# Patient Record
Sex: Female | Born: 1975 | Race: White | Hispanic: Yes | Marital: Married | Smoking: Never smoker
Health system: Southern US, Community
[De-identification: ages and names within clinical notes are randomized; demographics above are authoritative.]

## PROBLEM LIST (undated history)

## (undated) DIAGNOSIS — Z789 Other specified health status: Secondary | ICD-10-CM

## (undated) HISTORY — PX: NO PAST SURGERIES: SHX2092

---

## 2004-05-12 ENCOUNTER — Ambulatory Visit (HOSPITAL_COMMUNITY): Admission: RE | Admit: 2004-05-12 | Discharge: 2004-05-12 | Payer: Self-pay | Admitting: Obstetrics

## 2004-11-13 ENCOUNTER — Inpatient Hospital Stay (HOSPITAL_COMMUNITY): Admission: AD | Admit: 2004-11-13 | Discharge: 2004-11-15 | Payer: Self-pay | Admitting: Obstetrics

## 2011-12-29 NOTE — L&D Delivery Note (Signed)
Delivery Note At 2:07 PM a viable female was delivered via Vaginal, Spontaneous Delivery (Presentation: Left Occiput Anterior).  APGAR: 9, 9; weight .   Placenta status: , .  Cord: 3 vessels with the following complications: None.  Cord pH: not vdone  Anesthesia: Epidural  Episiotomy: None Lacerations: 1st degree Suture Repair: 2.0 vicryl Est. Blood Loss (mL):   Mom to postpartum.  Baby to nursery-stable.  Michelle Hoover A 10/25/2012, 2:30 PM

## 2012-04-01 LAB — OB RESULTS CONSOLE RPR: RPR: NONREACTIVE

## 2012-04-01 LAB — OB RESULTS CONSOLE HEPATITIS B SURFACE ANTIGEN: Hepatitis B Surface Ag: NEGATIVE

## 2012-04-01 LAB — OB RESULTS CONSOLE GC/CHLAMYDIA
Chlamydia: NEGATIVE
Gonorrhea: NEGATIVE

## 2012-04-01 LAB — OB RESULTS CONSOLE RUBELLA ANTIBODY, IGM: Rubella: IMMUNE

## 2012-04-04 LAB — OB RESULTS CONSOLE HIV ANTIBODY (ROUTINE TESTING): HIV: NONREACTIVE

## 2012-09-15 LAB — OB RESULTS CONSOLE GBS: GBS: POSITIVE

## 2012-10-19 ENCOUNTER — Inpatient Hospital Stay (HOSPITAL_COMMUNITY): Admission: RE | Admit: 2012-10-19 | Payer: Self-pay | Source: Ambulatory Visit | Admitting: Obstetrics

## 2012-10-23 ENCOUNTER — Encounter (HOSPITAL_COMMUNITY): Payer: Self-pay | Admitting: *Deleted

## 2012-10-23 ENCOUNTER — Inpatient Hospital Stay (HOSPITAL_COMMUNITY)
Admission: AD | Admit: 2012-10-23 | Discharge: 2012-10-23 | Disposition: A | Discharge status: Hospice | Payer: Self-pay | Source: Ambulatory Visit | Attending: Obstetrics | Admitting: Obstetrics

## 2012-10-23 DIAGNOSIS — O479 False labor, unspecified: Secondary | ICD-10-CM | POA: Insufficient documentation

## 2012-10-23 HISTORY — DX: Other specified health status: Z78.9

## 2012-10-23 NOTE — MAU Note (Signed)
Pt states she was having contractions 10 min apart this morning but during the day have spaced out-she wants to make sure the baby is OK

## 2012-10-24 ENCOUNTER — Inpatient Hospital Stay (HOSPITAL_COMMUNITY)
Admission: AD | Admit: 2012-10-24 | Discharge: 2012-10-27 | DRG: 775 | Disposition: A | Discharge status: Home / Self Care | Payer: Medicaid Other | Source: Ambulatory Visit | Attending: Obstetrics | Admitting: Obstetrics

## 2012-10-24 ENCOUNTER — Encounter (HOSPITAL_COMMUNITY): Payer: Self-pay

## 2012-10-24 DIAGNOSIS — O09529 Supervision of elderly multigravida, unspecified trimester: Secondary | ICD-10-CM | POA: Diagnosis present

## 2012-10-24 DIAGNOSIS — Z22330 Carrier of Group B streptococcus: Secondary | ICD-10-CM

## 2012-10-24 DIAGNOSIS — O99892 Other specified diseases and conditions complicating childbirth: Secondary | ICD-10-CM | POA: Diagnosis present

## 2012-10-24 LAB — CBC
Hemoglobin: 10.8 g/dL — ABNORMAL LOW (ref 12.0–15.0)
MCHC: 31.3 g/dL (ref 30.0–36.0)
Platelets: 279 10*3/uL (ref 150–400)
RDW: 14.9 % (ref 11.5–15.5)
WBC: 8.9 10*3/uL (ref 4.0–10.5)

## 2012-10-24 MED ORDER — CITRIC ACID-SODIUM CITRATE 334-500 MG/5ML PO SOLN: 30.0000 mL | ORAL | Status: DC | PRN

## 2012-10-24 MED ORDER — OXYCODONE-ACETAMINOPHEN 5-325 MG PO TABS: 1.0000 | ORAL_TABLET | ORAL | Status: DC | PRN

## 2012-10-24 MED ORDER — LACTATED RINGERS IV SOLN
INTRAVENOUS | Status: DC
  Administered 2012-10-24 – 2012-10-25 (×3): via INTRAVENOUS

## 2012-10-24 MED ORDER — LACTATED RINGERS IV SOLN: 500.0000 mL | INTRAVENOUS | Status: DC | PRN

## 2012-10-24 MED ORDER — CLINDAMYCIN PHOSPHATE 900 MG/50ML IV SOLN
900.0000 mg | Freq: Three times a day (TID) | INTRAVENOUS | Status: DC
  Administered 2012-10-24 – 2012-10-25 (×3): 900 mg via INTRAVENOUS
  Filled 2012-10-24 (×5): qty 50

## 2012-10-24 MED ORDER — OXYTOCIN 40 UNITS IN LACTATED RINGERS INFUSION - SIMPLE MED
62.5000 mL/h | INTRAVENOUS | Status: DC
  Filled 2012-10-24: qty 1000

## 2012-10-24 MED ORDER — BUTORPHANOL TARTRATE 1 MG/ML IJ SOLN
1.0000 mg | INTRAMUSCULAR | Status: DC | PRN
  Administered 2012-10-24 – 2012-10-25 (×3): 1 mg via INTRAVENOUS
  Filled 2012-10-24 (×3): qty 1

## 2012-10-24 MED ORDER — OXYTOCIN BOLUS FROM INFUSION
500.0000 mL | INTRAVENOUS | Status: DC
  Filled 2012-10-24 (×87): qty 500

## 2012-10-24 MED ORDER — ONDANSETRON HCL 4 MG/2ML IJ SOLN: 4.0000 mg | Freq: Four times a day (QID) | INTRAMUSCULAR | Status: DC | PRN

## 2012-10-24 MED ORDER — LIDOCAINE HCL (PF) 1 % IJ SOLN
30.0000 mL | INTRAMUSCULAR | Status: DC | PRN
  Filled 2012-10-24: qty 30

## 2012-10-24 MED ORDER — ACETAMINOPHEN 325 MG PO TABS: 650.0000 mg | ORAL_TABLET | ORAL | Status: DC | PRN

## 2012-10-24 MED ORDER — IBUPROFEN 600 MG PO TABS
600.0000 mg | ORAL_TABLET | Freq: Four times a day (QID) | ORAL | Status: DC | PRN
  Filled 2012-10-24 (×5): qty 1

## 2012-10-25 ENCOUNTER — Encounter (HOSPITAL_COMMUNITY): Payer: Self-pay | Admitting: Anesthesiology

## 2012-10-25 ENCOUNTER — Encounter (HOSPITAL_COMMUNITY): Payer: Self-pay | Admitting: *Deleted

## 2012-10-25 ENCOUNTER — Inpatient Hospital Stay (HOSPITAL_COMMUNITY): Payer: Medicaid Other | Admitting: Anesthesiology

## 2012-10-25 LAB — TYPE AND SCREEN

## 2012-10-25 LAB — ABO/RH: ABO/RH(D): O POS

## 2012-10-25 MED ORDER — EPHEDRINE 5 MG/ML INJ: 10.0000 mg | INTRAVENOUS | Status: DC | PRN

## 2012-10-25 MED ORDER — SIMETHICONE 80 MG PO CHEW: 80.0000 mg | CHEWABLE_TABLET | ORAL | Status: DC | PRN

## 2012-10-25 MED ORDER — PHENYLEPHRINE 40 MCG/ML (10ML) SYRINGE FOR IV PUSH (FOR BLOOD PRESSURE SUPPORT)
80.0000 ug | PREFILLED_SYRINGE | INTRAVENOUS | Status: DC | PRN
  Filled 2012-10-25: qty 5

## 2012-10-25 MED ORDER — DIBUCAINE 1 % RE OINT: 1.0000 "application " | TOPICAL_OINTMENT | RECTAL | Status: DC | PRN

## 2012-10-25 MED ORDER — PRENATAL MULTIVITAMIN CH
1.0000 | ORAL_TABLET | Freq: Every day | ORAL | Status: DC
  Administered 2012-10-26 – 2012-10-27 (×2): 1 via ORAL
  Filled 2012-10-25 (×2): qty 1

## 2012-10-25 MED ORDER — LACTATED RINGERS IV SOLN: 500.0000 mL | Freq: Once | INTRAVENOUS | Status: DC

## 2012-10-25 MED ORDER — FENTANYL 2.5 MCG/ML BUPIVACAINE 1/10 % EPIDURAL INFUSION (WH - ANES)
14.0000 mL/h | INTRAMUSCULAR | Status: DC
Start: 2012-10-25 — End: 2012-10-25
  Administered 2012-10-25: 14 mL/h via EPIDURAL
  Filled 2012-10-25: qty 125

## 2012-10-25 MED ORDER — ONDANSETRON HCL 4 MG PO TABS: 4.0000 mg | ORAL_TABLET | ORAL | Status: DC | PRN

## 2012-10-25 MED ORDER — ZOLPIDEM TARTRATE 5 MG PO TABS: 5.0000 mg | ORAL_TABLET | Freq: Every evening | ORAL | Status: DC | PRN

## 2012-10-25 MED ORDER — EPHEDRINE 5 MG/ML INJ
10.0000 mg | INTRAVENOUS | Status: DC | PRN
  Filled 2012-10-25: qty 4

## 2012-10-25 MED ORDER — DIPHENHYDRAMINE HCL 25 MG PO CAPS: 25.0000 mg | ORAL_CAPSULE | Freq: Four times a day (QID) | ORAL | Status: DC | PRN

## 2012-10-25 MED ORDER — PHENYLEPHRINE 40 MCG/ML (10ML) SYRINGE FOR IV PUSH (FOR BLOOD PRESSURE SUPPORT): 80.0000 ug | PREFILLED_SYRINGE | INTRAVENOUS | Status: DC | PRN

## 2012-10-25 MED ORDER — LIDOCAINE HCL (PF) 1 % IJ SOLN
INTRAMUSCULAR | Status: DC | PRN
  Administered 2012-10-25 (×4): 4 mL

## 2012-10-25 MED ORDER — TETANUS-DIPHTH-ACELL PERTUSSIS 5-2.5-18.5 LF-MCG/0.5 IM SUSP
0.5000 mL | Freq: Once | INTRAMUSCULAR | Status: AC
  Administered 2012-10-26: 0.5 mL via INTRAMUSCULAR
  Filled 2012-10-25: qty 0.5

## 2012-10-25 MED ORDER — SENNOSIDES-DOCUSATE SODIUM 8.6-50 MG PO TABS
2.0000 | ORAL_TABLET | Freq: Every day | ORAL | Status: DC
  Administered 2012-10-25 – 2012-10-26 (×2): 2 via ORAL

## 2012-10-25 MED ORDER — BENZOCAINE-MENTHOL 20-0.5 % EX AERO
1.0000 "application " | INHALATION_SPRAY | CUTANEOUS | Status: DC | PRN
  Administered 2012-10-25: 1 via TOPICAL
  Filled 2012-10-25: qty 56

## 2012-10-25 MED ORDER — INFLUENZA VIRUS VACC SPLIT PF IM SUSP
0.5000 mL | INTRAMUSCULAR | Status: AC
  Administered 2012-10-26: 0.5 mL via INTRAMUSCULAR
  Filled 2012-10-25: qty 0.5

## 2012-10-25 MED ORDER — DIPHENHYDRAMINE HCL 50 MG/ML IJ SOLN: 12.5000 mg | INTRAMUSCULAR | Status: DC | PRN

## 2012-10-25 MED ORDER — WITCH HAZEL-GLYCERIN EX PADS: 1.0000 "application " | MEDICATED_PAD | CUTANEOUS | Status: DC | PRN

## 2012-10-25 MED ORDER — OXYCODONE-ACETAMINOPHEN 5-325 MG PO TABS: 1.0000 | ORAL_TABLET | ORAL | Status: DC | PRN

## 2012-10-25 MED ORDER — FERROUS SULFATE 325 (65 FE) MG PO TABS
325.0000 mg | ORAL_TABLET | Freq: Two times a day (BID) | ORAL | Status: DC
  Administered 2012-10-26 – 2012-10-27 (×3): 325 mg via ORAL
  Filled 2012-10-25 (×3): qty 1

## 2012-10-25 MED ORDER — OXYTOCIN 40 UNITS IN LACTATED RINGERS INFUSION - SIMPLE MED
1.0000 m[IU]/min | INTRAVENOUS | Status: DC
  Administered 2012-10-25: 1 m[IU]/min via INTRAVENOUS

## 2012-10-25 MED ORDER — TERBUTALINE SULFATE 1 MG/ML IJ SOLN: 0.2500 mg | Freq: Once | INTRAMUSCULAR | Status: DC | PRN

## 2012-10-25 MED ORDER — IBUPROFEN 600 MG PO TABS
600.0000 mg | ORAL_TABLET | Freq: Four times a day (QID) | ORAL | Status: DC
  Administered 2012-10-25 – 2012-10-27 (×8): 600 mg via ORAL
  Filled 2012-10-25 (×3): qty 1

## 2012-10-25 MED ORDER — ONDANSETRON HCL 4 MG/2ML IJ SOLN: 4.0000 mg | INTRAMUSCULAR | Status: DC | PRN

## 2012-10-25 MED ORDER — LANOLIN HYDROUS EX OINT: TOPICAL_OINTMENT | CUTANEOUS | Status: DC | PRN

## 2012-10-25 NOTE — Progress Notes (Signed)
Debbie the Interpreter at bedside

## 2012-10-25 NOTE — Anesthesia Preprocedure Evaluation (Signed)

## 2012-10-25 NOTE — Anesthesia Procedure Notes (Signed)
Epidural Patient location during procedure: OB Start time: 10/25/2012 7:58 AM  Staffing Performed by: anesthesiologist   Preanesthetic Checklist Completed: patient identified, site marked, surgical consent, pre-op evaluation, timeout performed, IV checked, risks and benefits discussed and monitors and equipment checked  Epidural Patient position: sitting Prep: site prepped and draped and DuraPrep Patient monitoring: continuous pulse ox and blood pressure Approach: midline Injection technique: LOR air  Needle:  Needle type: Tuohy  Needle gauge: 17 G Needle length: 9 cm and 9 Needle insertion depth: 5 cm cm Catheter type: closed end flexible Catheter size: 19 Gauge Catheter at skin depth: 10 cm Test dose: negative  Assessment Events: blood not aspirated, injection not painful, no injection resistance, negative IV test and no paresthesia  Additional Notes Discussed risk of headache, infection, bleeding, nerve injury and failed or incomplete block.  Patient voices understanding and wishes to proceed. Reason for block:procedure for pain

## 2012-10-25 NOTE — H&P (Signed)
This is Dr. Francoise Ceo dictating the history and physical on  Michelle Hoover she's a 36 year old gravida 3 para 2002 at 12 weeks and 3 days [facility] 10/14/2012 she was scheduled for induction last week Wednesday and the patient did not appear at the hospital and were unable to get in touch with her positive GBS and received Cleocin 900 IV every 8 hours admitted in labor cervix 3 cm 90% vertex -2 station amniotomy performed fluid meconium-stained she is contracting on her own every 2-3 minutes Past medical history negative Past surgical history negative Social history noncontributory System review negative Physical exam revealed a well-developed female in labor HEENT negative Lungs clear to P&A Heart regular rhythm no murmurs no gallops Abdomen term Pelvic as described above Extremities negative

## 2012-10-25 NOTE — Progress Notes (Signed)
Patient ID: Michelle Hoover, female   DOB: 1976/06/19, 36 y.o.   MRN: [id number] Patient is 3 cm last night on admission and she's now 7 cm vertex -1-2 station possible OP she is going to get an epidural been IUPC were placed to evaluate her labor

## 2012-10-26 LAB — CBC
HCT: 28.5 % — ABNORMAL LOW (ref 36.0–46.0)
MCH: 24.4 pg — ABNORMAL LOW (ref 26.0–34.0)
MCHC: 30.9 g/dL (ref 30.0–36.0)
RDW: 15.4 % (ref 11.5–15.5)

## 2012-10-26 NOTE — Progress Notes (Signed)
UR chart review completed.  

## 2012-10-26 NOTE — Progress Notes (Signed)
Patient ID: Michelle Hoover, female   DOB: 1976/08/23, 36 y.o.   MRN: [id number] Postpartum day one Vital signs normal Fundus firm Lochia moderate Legs negative doing well and and

## 2012-10-26 NOTE — Anesthesia Postprocedure Evaluation (Signed)
  Anesthesia Post-op Note  Patient: Michelle Hoover  Procedure(s) Performed: * No procedures listed *  Patient Location: Mother/Baby  Anesthesia Type:Epidural  Level of Consciousness: awake, alert  and oriented  Airway and Oxygen Therapy: Patient Spontanous Breathing  Post-op Pain: none  Post-op Assessment: Patient's Cardiovascular Status Stable, Respiratory Function Stable, Patent Airway, No signs of Nausea or vomiting, Adequate PO intake, Pain level controlled, No headache, No backache, No residual numbness and No residual motor weakness  Post-op Vital Signs: Reviewed and stable  Complications: No apparent anesthesia complications

## 2012-10-27 NOTE — Discharge Summary (Signed)
  Discharge instructions   You can wash your hair  Shower  Eat what you want  Drink what you want  See me in 6 weeks  Your ankles are going to swell more in the next 2 weeks than when pregnant  No sex for 6 weeks   MARSHALL,BERNARD A, MD 10/27/2012

## 2012-10-27 NOTE — Discharge Summary (Signed)
Obstetric Discharge Summary Reason for Admission: onset of labor Prenatal Procedures: none Intrapartum Procedures: spontaneous vaginal delivery Postpartum Procedures: none Complications-Operative and Postpartum: none Hemoglobin  Date Value Range Status  10/26/2012 8.8* 12.0 - 15.0 g/dL Final     DELTA CHECK NOTED     REPEATED TO VERIFY     HCT  Date Value Range Status  10/26/2012 28.5* 36.0 - 46.0 % Final    Physical Exam:  General: alert Lochia: appropriate Uterine Fundus: firm Incision: healing well DVT Evaluation: No evidence of DVT seen on physical exam.  Discharge Diagnoses: Term Pregnancy-delivered  Discharge Information: Date: 10/27/2012 Activity: pelvic rest Diet: routine Medications: Percocet Condition: stable Instructions: refer to practice specific booklet Discharge to: home Follow-up Information    Call in 6 weeks to follow up.   Contact information:   b marshall         Newborn Data: Live born female  Birth Weight: 8 lb 3 oz (3715 g) APGAR: 9, 9  Home with mother.  MARSHALL,BERNARD A 10/27/2012, 6:55 AM

## 2014-10-29 ENCOUNTER — Encounter (HOSPITAL_COMMUNITY): Payer: Self-pay | Admitting: *Deleted

## 2017-02-08 ENCOUNTER — Ambulatory Visit: Payer: Self-pay | Attending: Family Medicine | Admitting: Family Medicine

## 2017-02-08 VITALS — BP 105/68 | HR 55 | Temp 98.2°F | Resp 18 | Ht 63.0 in | Wt 150.4 lb

## 2017-02-08 DIAGNOSIS — W009XXA Unspecified fall due to ice and snow, initial encounter: Secondary | ICD-10-CM

## 2017-02-08 DIAGNOSIS — S0990XA Unspecified injury of head, initial encounter: Secondary | ICD-10-CM | POA: Insufficient documentation

## 2017-02-08 DIAGNOSIS — R42 Dizziness and giddiness: Secondary | ICD-10-CM | POA: Insufficient documentation

## 2017-02-08 DIAGNOSIS — M542 Cervicalgia: Secondary | ICD-10-CM | POA: Insufficient documentation

## 2017-02-08 DIAGNOSIS — W000XXA Fall on same level due to ice and snow, initial encounter: Secondary | ICD-10-CM | POA: Insufficient documentation

## 2017-02-08 MED ORDER — IBUPROFEN 800 MG PO TABS: 800.0000 mg | ORAL_TABLET | Freq: Three times a day (TID) | ORAL | 0 refills | Status: DC | PRN

## 2017-02-08 NOTE — Progress Notes (Signed)
Patient is here because she complains head & neck pain due to slip on ice  Patient has eaten today  Patient has not taking any meds today

## 2017-02-08 NOTE — Progress Notes (Signed)
Subjective:  Patient ID: Michelle Hoover, female    DOB: 11/14/1976  Age: 41 y.o. MRN: [id number]  CC: Establish Care   HPI Michelle Hoover presents for complaints of headache pain, neck pain, and dizziness. Reports 4 weeks ago slipping on a patch of ice falling backwards and hitting her head on the ground. She reports intermittent headaches. She reports pain 6 out of 10. She denies any blurred vision, nausea, or vomiting, tinnitus, weakness of the extremities, or paresthesias. She denies any tenderness or lump. She reports dizziness for a week after her fall. She reports dizziness is exacerbated with bending forward. Reports last episode of dizziness Monday of last week. She reports taking over-the-counter ibuprofen with minimal relief of symptoms.   No outpatient prescriptions prior to visit.   No facility-administered medications prior to visit.     ROS Review of Systems  Eyes: Negative.   Respiratory: Negative.   Cardiovascular: Negative.   Musculoskeletal: Positive for myalgias.  Neurological: Positive for dizziness and headaches.     Objective:  BP 105/68 (BP Location: Left Arm, Patient Position: Sitting, Cuff Size: Normal)   Pulse (!) 55   Temp 98.2 F (36.8 C) (Oral)   Resp 18   Ht 5\' 3"  (1.6 m)   Wt 150 lb 6.4 oz (68.2 kg)   LMP 02/07/2017   SpO2 97%   BMI 26.64 kg/m   BP/Weight 02/08/2017 10/27/2012 10/24/2012  Systolic BP 105 116 -  Diastolic BP 68 68 -  Wt. (Lbs) 150.4 - 164  BMI 26.64 - 32.03     Physical Exam  Constitutional: She is oriented to person, place, and time.  HENT:  Head: Normocephalic.    Right Ear: External ear normal.  Left Ear: External ear normal.  Nose: Nose normal.  Mouth/Throat: Oropharynx is clear and moist.  Eyes: Conjunctivae and EOM are normal. Pupils are equal, round, and reactive to light.  Neck: Normal range of motion.  Cardiovascular: Normal rate, regular rhythm, normal heart sounds and intact distal pulses.     Pulmonary/Chest: Effort normal and breath sounds normal.  Abdominal: Soft. Bowel sounds are normal.  Musculoskeletal: She exhibits tenderness.       Cervical back: She exhibits pain.  Neurological: She is alert and oriented to person, place, and time. She has normal reflexes. No cranial nerve deficit. Coordination and gait normal.  Skin: Skin is warm and dry.  Psychiatric: She has a normal mood and affect. Her behavior is normal. Thought content normal.  Nursing note and vitals reviewed.    Assessment & Plan:   Problem List Items Addressed This Visit    None    Visit Diagnoses    Fall due to ice or snow, initial encounter    -  Primary   Relevant Orders   CT HEAD WO CONTRAST (Completed)   Recent head trauma, initial encounter       Relevant Orders   CT HEAD WO CONTRAST (Completed)   Musculoskeletal neck pain       Relevant Medications   ibuprofen (ADVIL,MOTRIN) 800 MG tablet      Meds ordered this encounter  Medications  . ibuprofen (ADVIL,MOTRIN) 800 MG tablet    Sig: Take 1 tablet (800 mg total) by mouth every 8 (eight) hours as needed for moderate pain.    Dispense:  40 tablet    Refill:  0    Order Specific Question:   Supervising Provider    Answer:   Quentin AngstJEGEDE, OLUGBEMIGA E [id number]  Alfonse Spruce FNP

## 2017-02-09 ENCOUNTER — Encounter (HOSPITAL_COMMUNITY): Payer: Self-pay

## 2017-02-09 ENCOUNTER — Ambulatory Visit (HOSPITAL_COMMUNITY)
Admission: RE | Admit: 2017-02-09 | Discharge: 2017-02-09 | Disposition: A | Discharge status: Home / Self Care | Payer: Self-pay | Source: Ambulatory Visit | Attending: Family Medicine | Admitting: Family Medicine

## 2017-02-09 DIAGNOSIS — W009XXA Unspecified fall due to ice and snow, initial encounter: Secondary | ICD-10-CM | POA: Insufficient documentation

## 2017-02-09 DIAGNOSIS — S0990XA Unspecified injury of head, initial encounter: Secondary | ICD-10-CM | POA: Insufficient documentation

## 2017-02-15 ENCOUNTER — Telehealth: Payer: Self-pay

## 2017-02-15 NOTE — Telephone Encounter (Signed)
-----   Message from Lizbeth BarkMandesia R Hairston, FNP sent at 02/15/2017  4:41 PM EST ----- -Imaging study of the head was normal. No fractures, bleeding, swelling, or mass of the brain or skull present.  -Follow up if symptoms do not improve. -If you start to develop any confusion, projectile vomiting, vision disturbance, difficulty keeping your balance or decreased level of consciousness got to the emergency department.

## 2017-02-15 NOTE — Telephone Encounter (Signed)
CMA call to go over ct scan of head  Patient did not answer VM was full to leave a message

## 2017-02-16 NOTE — Telephone Encounter (Signed)
CMA call to go over results of CT scan of head  Patient verify DOB  Patient was aware and understood the results being normal

## 2017-02-16 NOTE — Telephone Encounter (Signed)
-----   Message from Mandesia R Hairston, FNP sent at 02/15/2017  4:41 PM EST ----- -Imaging study of the head was normal. No fractures, bleeding, swelling, or mass of the brain or skull present.  -Follow up if symptoms do not improve. -If you start to develop any confusion, projectile vomiting, vision disturbance, difficulty keeping your balance or decreased level of consciousness got to the emergency department.   

## 2017-02-17 ENCOUNTER — Ambulatory Visit (INDEPENDENT_AMBULATORY_CARE_PROVIDER_SITE_OTHER): Payer: Self-pay | Admitting: Internal Medicine

## 2017-02-17 ENCOUNTER — Encounter: Payer: Self-pay | Admitting: Internal Medicine

## 2017-02-17 ENCOUNTER — Ambulatory Visit: Payer: Self-pay

## 2017-02-17 VITALS — BP 100/64 | HR 78 | Resp 16 | Ht 62.0 in | Wt 150.5 lb

## 2017-02-17 DIAGNOSIS — K029 Dental caries, unspecified: Secondary | ICD-10-CM

## 2017-02-17 NOTE — Progress Notes (Signed)
   Subjective:    Patient ID: Michelle Hoover, female    DOB: 06/28/1976, 41 y.o.   MRN: [id number]  HPI Here to establish  1.  Slipped on ice about 1 month ago, striking her occipital head on cement. [facility].  Wants to know if CT scan of head was okay and it was.  Discussed Discussed also she needs to pick one place to be her primary care and not go back and forth between clinics.   She has an orange card with [employer].  Discussed if she chooses to continue here, she needs to renew with [employer] on card.  2.  Has cavity in upper right molar.  Would like to get it looked at and treated by Dental Clinic.  Current Meds  Medication Sig  . ibuprofen (ADVIL,MOTRIN) 800 MG tablet Take 1 tablet (800 mg total) by mouth every 8 (eight) hours as needed for moderate pain.    Allergies  Allergen Reactions  . Penicillins Other (See Comments)    Rxn from childhood; lost all feeling in legs immediately after IM injection in hip.    No past medical history on file.   Past Surgical History:  Procedure Laterality Date  . NO PAST SURGERIES      Family History  Problem Relation Age of Onset  . Cancer Paternal Uncle   . Diabetes Paternal Grandmother   . Hypertension Mother   . Stroke Mother   . Diabetes Father   . Peripheral vascular disease Father     amputations of legs  . Epilepsy Sister     Social History   Social History  . Marital status: Married    Spouse name: Michelle Hoover  . Number of children: 3  . Years of education: 9   Occupational History  . Housewife    Social History Main Topics  . Smoking status: Never Smoker  . Smokeless tobacco: Never Used  . Alcohol use No  . Drug use: No  . Sexual activity: Not Currently   Other Topics Concern  . Not on file   Social History Narrative   Originally [place]   [place]   Lives at home with husband and 3 childrens    Review of Systems     Objective:   Physical Exam NAD HEENT:  PERRL, EOMI, TMs pearly gray, throat without injection.  Many fillings. Darkening in right premolar she points to. Neck:  Supple, No adenopathy Chest:  CTA CV:  RRR with normal S1 and S2, No S3, S4 or murmur, radial pulses normal and equal       Assessment & Plan:  1.  Dental decay:  Dental referral.  Discussed will be a delay as not opening up to new patients until mid March  Return for fasting labs and CPE with pap in 3 months

## 2017-05-27 ENCOUNTER — Encounter: Payer: Self-pay | Admitting: Internal Medicine

## 2017-05-27 ENCOUNTER — Ambulatory Visit (INDEPENDENT_AMBULATORY_CARE_PROVIDER_SITE_OTHER): Payer: Self-pay | Admitting: Internal Medicine

## 2017-05-27 VITALS — BP 112/82 | HR 74 | Resp 12 | Ht 62.0 in | Wt 144.0 lb

## 2017-05-27 DIAGNOSIS — Z124 Encounter for screening for malignant neoplasm of cervix: Secondary | ICD-10-CM

## 2017-05-27 DIAGNOSIS — Z131 Encounter for screening for diabetes mellitus: Secondary | ICD-10-CM

## 2017-05-27 DIAGNOSIS — Z13220 Encounter for screening for lipoid disorders: Secondary | ICD-10-CM

## 2017-05-27 DIAGNOSIS — Z1231 Encounter for screening mammogram for malignant neoplasm of breast: Secondary | ICD-10-CM

## 2017-05-27 DIAGNOSIS — Z0000 Encounter for general adult medical examination without abnormal findings: Secondary | ICD-10-CM

## 2017-05-27 DIAGNOSIS — Z1239 Encounter for other screening for malignant neoplasm of breast: Secondary | ICD-10-CM

## 2017-05-27 LAB — POCT WET PREP WITH KOH
Clue Cells Wet Prep HPF POC: NEGATIVE
KOH PREP POC: NEGATIVE
RBC Wet Prep HPF POC: NEGATIVE
Trichomonas, UA: NEGATIVE
YEAST WET PREP PER HPF POC: NEGATIVE

## 2017-05-27 NOTE — Progress Notes (Signed)
Subjective:    Patient ID: Michelle Hoover, female    DOB: 07-21-1976, 41 y.o.   MRN: [id number]  HPI   CPE with pap  1.  Pap: Never.  No family history  2.  Mammogram:  Never.  Distant cousin of her mother had breast cancer.  3.  Osteoprevention:  One serving of dairy daily. Runs and walks 1 hour daily.  No family history of osteoporosis.  4.  Guaiac Cards:  Never.  No family history  5.  Colonoscopy:  Never.    6.  Immunizations:   Immunization History  Administered Date(s) Administered  . Influenza Split 10/26/2012  . Tdap 10/26/2012    7.  Glucose/Cholesterol:  No history of elevated glucose or cholesterol.  No outpatient prescriptions have been marked as taking for the 05/27/17 encounter (Office Visit) with Julieanne Manson, MD.   Allergies  Allergen Reactions  . Penicillins Other (See Comments)    Rxn from childhood; lost all feeling in legs immediately after IM injection in hip.   No past medical history on file.   No past surgical history on file.   Family History  Problem Relation Age of Onset  . Cancer Paternal Uncle   . Diabetes Paternal Grandmother   . Hypertension Mother   . Stroke Mother   . Diabetes Father   . Peripheral vascular disease Father        amputations of legs  . Epilepsy Sister     Social History   Social History  . Marital status: Married    Spouse name: Madelin Headings  . Number of children: 3  . Years of education: 9   Occupational History  . Housewife    Social History Main Topics  . Smoking status: Never Smoker  . Smokeless tobacco: Never Used  . Alcohol use No  . Drug use: No  . Sexual activity: Yes    Birth control/ protection: None   Other Topics Concern  . Not on file   Social History Narrative   Originally from [place]   C[place] to [place] In [place]   Lives at home with husband and 3 children      Review of Systems  Constitutional: Negative for appetite change, fatigue, fever and unexpected weight change.    HENT: Negative for dental problem, ear pain, hearing loss, postnasal drip, rhinorrhea, sneezing and sore throat.   Eyes: Negative for pain, redness, itching and visual disturbance.  Respiratory: Negative for cough and shortness of breath.   Cardiovascular: Negative for chest pain, palpitations and leg swelling.  Gastrointestinal: Negative for abdominal distention, blood in stool (No melena), constipation, diarrhea and nausea.  Genitourinary: Negative for dysuria, frequency, pelvic pain and vaginal discharge.  Musculoskeletal: Negative for arthralgias and joint swelling.  Skin: Negative for rash.  Neurological: Negative for dizziness, weakness, numbness and headaches.  Hematological: Negative for adenopathy. Does not bruise/bleed easily.  Psychiatric/Behavioral: Negative for dysphoric mood. The patient is not nervous/anxious.        Objective:   Physical Exam  Constitutional: She is oriented to person, place, and time. She appears well-developed and well-nourished.  HENT:  Head: Normocephalic and atraumatic.  Right Ear: Hearing, tympanic membrane, external ear and ear canal normal.  Left Ear: Hearing, tympanic membrane, external ear and ear canal normal.  Nose: Nose normal.  Mouth/Throat: Oropharynx is clear and moist and mucous membranes are normal.  Eyes: Conjunctivae and EOM are normal. Pupils are equal, round, and reactive to light.  Discs sharp bilaterally  Neck:  Normal range of motion and full passive range of motion without pain. Neck supple. No thyromegaly present.  Cardiovascular: Normal rate, regular rhythm, S1 normal, S2 normal and intact distal pulses.  Exam reveals no S3, no S4 and no friction rub.   No murmur heard. No carotid bruits.  Carotid, radial, femoral, DP and PT pulses normal and equal.   Pulmonary/Chest: Effort normal and breath sounds normal. Right breast exhibits no inverted nipple, no mass, no nipple discharge, no skin change and no tenderness. Left breast  exhibits no inverted nipple, no mass, no nipple discharge, no skin change and no tenderness.  Abdominal: Soft. Bowel sounds are normal. She exhibits no mass. There is no hepatosplenomegaly. There is no tenderness. No hernia.  Genitourinary:  Genitourinary Comments: Normal external genitalia.  No vaginal discharge,  No uterine or adnexal mass or tenderness. Rectal:  No mass, good tone.  Heme negative light brown stool.     Musculoskeletal: Normal range of motion.  Prominent right tibial tuberosity, nontender.  Lymphadenopathy:       Head (right side): No submental and no submandibular adenopathy present.       Head (left side): No submental and no submandibular adenopathy present.    She has no cervical adenopathy.    She has no axillary adenopathy.       Right: No inguinal and no supraclavicular adenopathy present.       Left: No inguinal and no supraclavicular adenopathy present.  Neurological: She is alert and oriented to person, place, and time. She has normal strength and normal reflexes. No cranial nerve deficit or sensory deficit. Coordination and gait normal.  Skin: Skin is warm and dry. No lesion and no rash noted.  Psychiatric: She has a normal mood and affect. Her speech is normal and behavior is normal. Judgment and thought content normal. Cognition and memory are normal.       Assessment & Plan:  1.  CPE with pap Scholarship for screening Mammogram Guaiac Cards x 3, to return in 2 weeks. CBC, CMP, Lipid panel Influenza vaccine recommended each fall Encourage 3 servings of dairy and deep green leafy vegetables daily

## 2017-05-28 LAB — COMPREHENSIVE METABOLIC PANEL
ALBUMIN: 4.5 g/dL (ref 3.5–5.5)
ALT: 16 IU/L (ref 0–32)
AST: 20 IU/L (ref 0–40)
Albumin/Globulin Ratio: 1.7 (ref 1.2–2.2)
Alkaline Phosphatase: 62 IU/L (ref 39–117)
BILIRUBIN TOTAL: 0.7 mg/dL (ref 0.0–1.2)
BUN / CREAT RATIO: 11 (ref 9–23)
BUN: 9 mg/dL (ref 6–24)
CO2: 25 mmol/L (ref 18–29)
CREATININE: 0.81 mg/dL (ref 0.57–1.00)
Calcium: 9.2 mg/dL (ref 8.7–10.2)
Chloride: 105 mmol/L (ref 96–106)
GFR calc non Af Amer: 90 mL/min/{1.73_m2} (ref >59)
GFR, EST AFRICAN AMERICAN: 104 mL/min/{1.73_m2} (ref >59)
GLOBULIN, TOTAL: 2.6 g/dL (ref 1.5–4.5)
Glucose: 81 mg/dL (ref 65–99)
Potassium: 4.5 mmol/L (ref 3.5–5.2)
SODIUM: 142 mmol/L (ref 134–144)
TOTAL PROTEIN: 7.1 g/dL (ref 6.0–8.5)

## 2017-05-28 LAB — CBC WITH DIFFERENTIAL/PLATELET
Basophils Absolute: 0 10*3/uL (ref 0.0–0.2)
Basos: 0 %
EOS (ABSOLUTE): 0.1 10*3/uL (ref 0.0–0.4)
EOS: 1 %
HEMATOCRIT: 38.8 % (ref 34.0–46.6)
HEMOGLOBIN: 12.7 g/dL (ref 11.1–15.9)
IMMATURE GRANS (ABS): 0 10*3/uL (ref 0.0–0.1)
Immature Granulocytes: 0 %
LYMPHS ABS: 1.7 10*3/uL (ref 0.7–3.1)
Lymphs: 31 %
MCH: 28.2 pg (ref 26.6–33.0)
MCHC: 32.7 g/dL (ref 31.5–35.7)
MCV: 86 fL (ref 79–97)
MONOCYTES: 6 %
Monocytes Absolute: 0.3 10*3/uL (ref 0.1–0.9)
NEUTROS ABS: 3.4 10*3/uL (ref 1.4–7.0)
Neutrophils: 62 %
Platelets: 284 10*3/uL (ref 150–379)
RBC: 4.5 x10E6/uL (ref 3.77–5.28)
RDW: 13.4 % (ref 12.3–15.4)
WBC: 5.4 10*3/uL (ref 3.4–10.8)

## 2017-05-28 LAB — LIPID PANEL W/O CHOL/HDL RATIO
Cholesterol, Total: 156 mg/dL (ref 100–199)
HDL: 42 mg/dL (ref >39)
LDL CALC: 87 mg/dL (ref 0–99)
Triglycerides: 133 mg/dL (ref 0–149)
VLDL CHOLESTEROL CAL: 27 mg/dL (ref 5–40)

## 2017-05-31 LAB — CYTOLOGY - PAP

## 2017-06-08 ENCOUNTER — Other Ambulatory Visit (INDEPENDENT_AMBULATORY_CARE_PROVIDER_SITE_OTHER): Payer: Self-pay

## 2017-06-08 DIAGNOSIS — Z1211 Encounter for screening for malignant neoplasm of colon: Secondary | ICD-10-CM

## 2017-06-08 LAB — POC HEMOCCULT BLD/STL (HOME/3-CARD/SCREEN)
Card #2 Fecal Occult Blod, POC: NEGATIVE
Card #3 Fecal Occult Blood, POC: NEGATIVE
Fecal Occult Blood, POC: NEGATIVE

## 2017-10-28 ENCOUNTER — Telehealth: Payer: Self-pay | Admitting: Internal Medicine

## 2017-10-28 NOTE — Telephone Encounter (Signed)
Please schedule for acute appointment for tomorrow

## 2017-10-28 NOTE — Telephone Encounter (Signed)
Patient called stating felt down a week ago when was walking out with her dog, and is feeling a little bit of pain in her hand when needs to hold or take anything with it. Patient stated hand is a little bit swollen and is taking ibuprofen every 8 hours for the pain.  Please adavise.

## 2017-10-29 ENCOUNTER — Encounter: Payer: Self-pay | Admitting: Internal Medicine

## 2017-10-29 ENCOUNTER — Ambulatory Visit (INDEPENDENT_AMBULATORY_CARE_PROVIDER_SITE_OTHER): Payer: Self-pay | Admitting: Internal Medicine

## 2017-10-29 VITALS — BP 118/78 | HR 62 | Resp 12 | Ht 62.0 in | Wt 143.0 lb

## 2017-10-29 DIAGNOSIS — Z23 Encounter for immunization: Secondary | ICD-10-CM

## 2017-10-29 DIAGNOSIS — B079 Viral wart, unspecified: Secondary | ICD-10-CM

## 2017-10-29 DIAGNOSIS — M25531 Pain in right wrist: Secondary | ICD-10-CM

## 2017-10-29 NOTE — Telephone Encounter (Signed)
Patient contacted to come to the clinic for an acute visit.

## 2017-10-29 NOTE — Progress Notes (Signed)
   Subjective:    Patient ID: Michelle Hoover, female    DOB: 04/15/1976, 41 y.o.   MRN: [id number]  HPI   Larey SeatFell forward 11 days ago on outstretched hands, more weight on right hand on cement.   She was standing after picking up her smallish dog to protect him from a larger dog.  Her dog jumped out of her arms and she fell forward trying to hold onto him. She has continue to have right wrist pain. Points to her distal ulna as source of pain.  Occurs when she is trying to lift something heavy but does not bother her with picking up light objects. Has taken Ibuprofen does not really help if picking up something heavy.    Current Meds  Medication Sig  . acetaminophen (TYLENOL) 500 MG tablet Take 500 mg by mouth every 6 (six) hours as needed.    Allergies  Allergen Reactions  . Penicillins Other (See Comments)    Rxn from childhood; lost all feeling in legs immediately after IM injection in hip.    Review of Systems     Objective:   Physical Exam  NAD Right wrist without swelling, erythema or disfigurement.  Full ROM, though with mild discomfort with ulnar deviation of wrist.  Mild tenderness over ulna styloid.  No crepitation or palpable effusion at ulnar wrist.  NT over carpals.   Radial pulse normal.  Good cap refill.  Normal sensory to light touch of each digit. Wart at PIP of right ring finger.      Assessment & Plan:  1.  Fall with continued mild pain at right ulna styloid area.  [facility].  Discussed subsequently applying for financial assistance [facility]. Will be unable to get a film through orange card to assess current situation anytime soon.  2.  Wart, right ring finger:  40% salicylic acid plasters for 6 weeks.  Discussed at length how to utilize.  3.  HM:  Influenza vaccine.

## 2017-10-29 NOTE — Patient Instructions (Signed)
Mediplast 40% salacylic acid --apply to wart and change daily for up to 6 weeks.

## 2018-07-24 IMAGING — CT CT HEAD W/O CM
3 series · 15 of 47 positions shown, 18 images · non-contrast
Comparison: None available

CLINICAL DATA: Fall, head injury, posterior headaches

EXAM:
CT HEAD WITHOUT CONTRAST
TECHNIQUE: Contiguous axial images were obtained from the base of the skull
through the vertex without intravenous contrast.

[Series 2: head wo · axial · 0.47mm/px · z∈[-124,+1]mm · 9 of 30 slices shown, 12 images]
[im 3/30  brain]
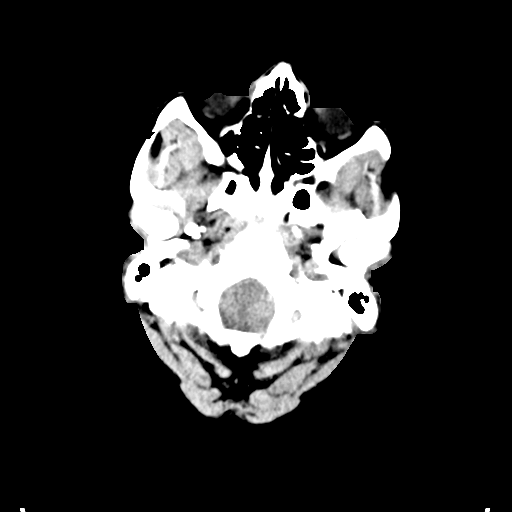
[im 3/30  bone]
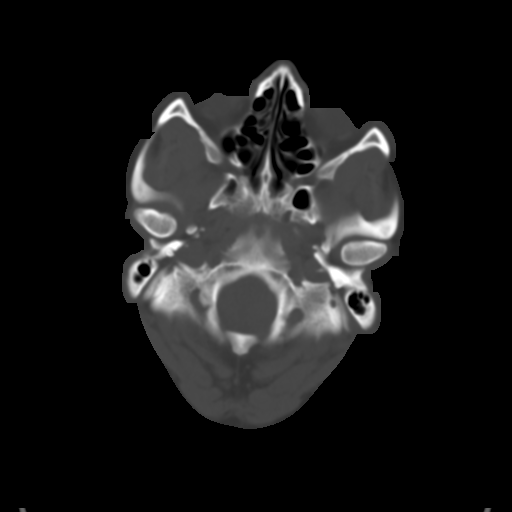
[im 6/30  brain]
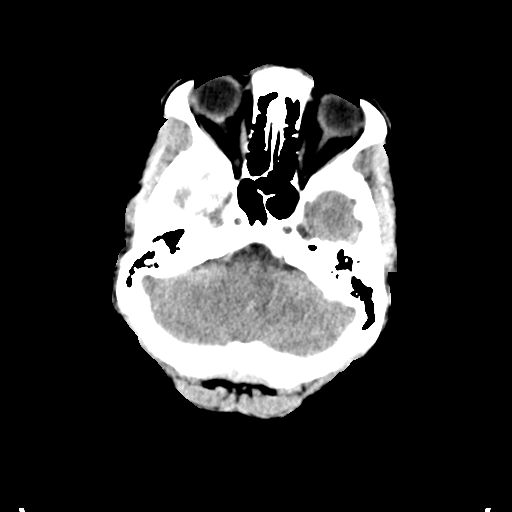
[im 9/30  brain]
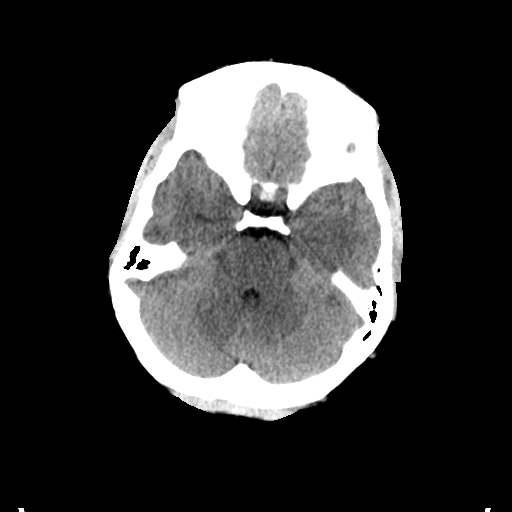
[im 12/30  brain]
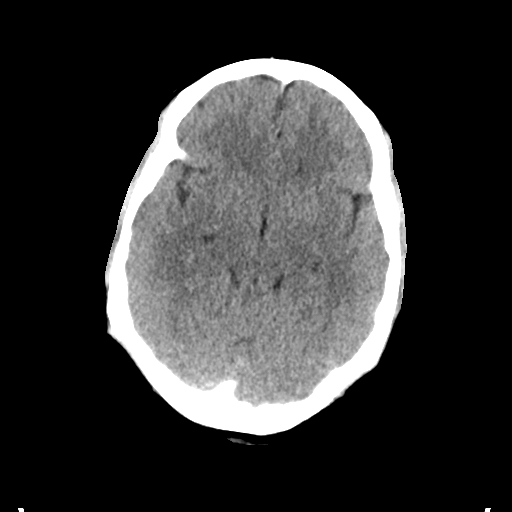
[im 16/30  brain]
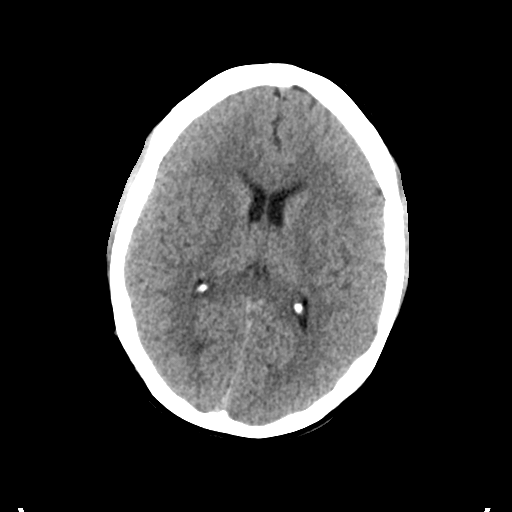
[im 16/30  bone]
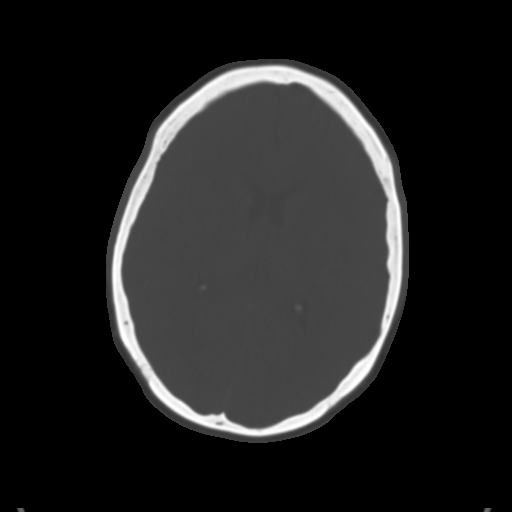
[im 19/30  brain]
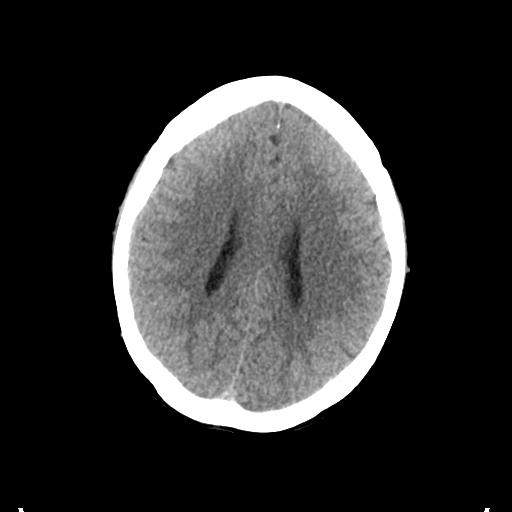
[im 22/30  brain]
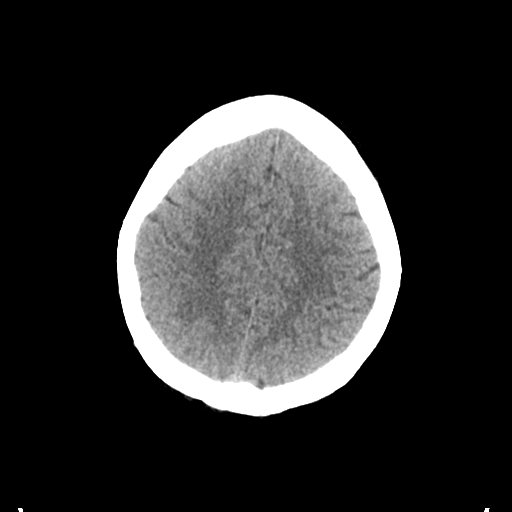
[im 25/30  brain]
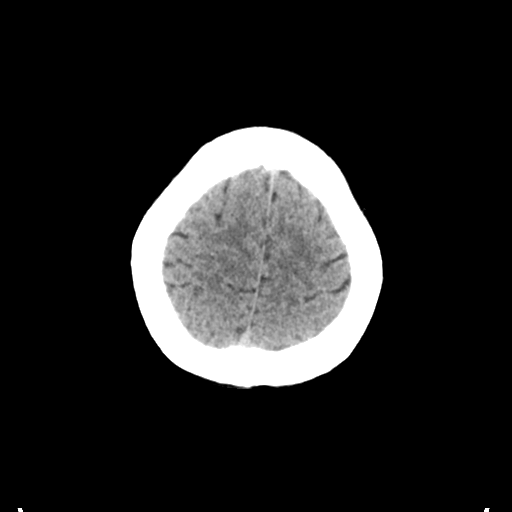
[im 28/30  brain]
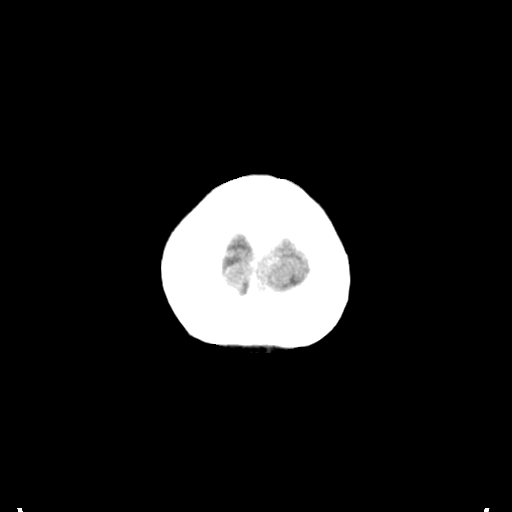
[im 28/30  bone]
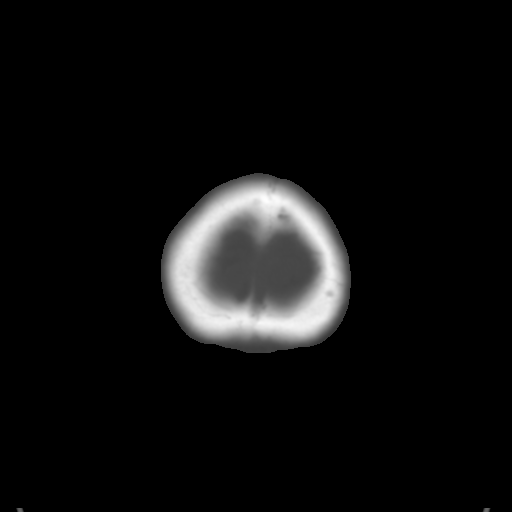

[Series 4: coronal soft tissue · coronal · 0.29mm/px · 3 of 67 slices shown]
[im 23/67  brain]
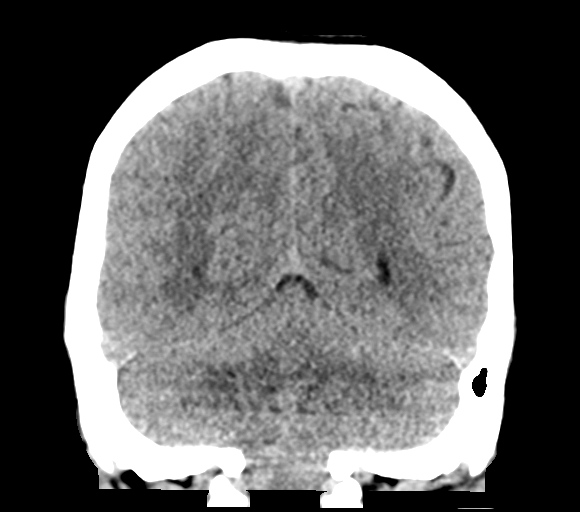
[im 30/67  brain]
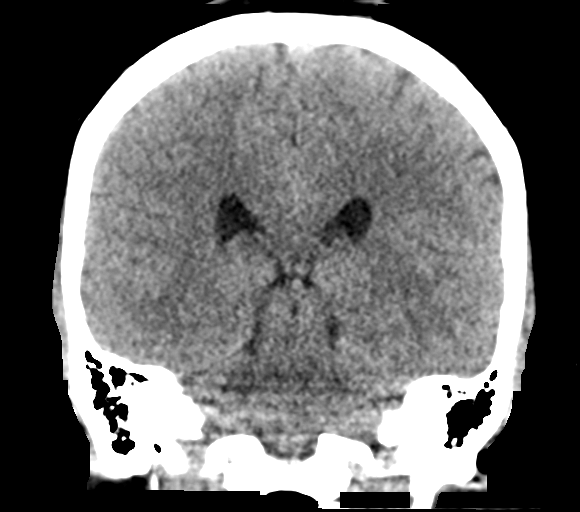
[im 37/67  brain]
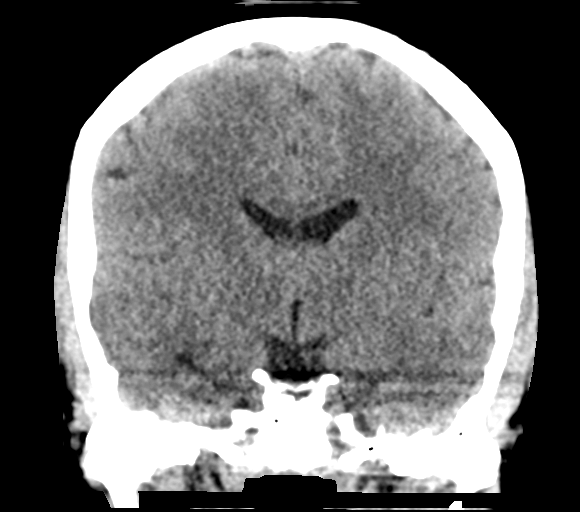

[Series 5: sagittal soft tissue · sagittal · 0.31mm/px · 3 of 65 slices shown]
[im 22/65  brain]
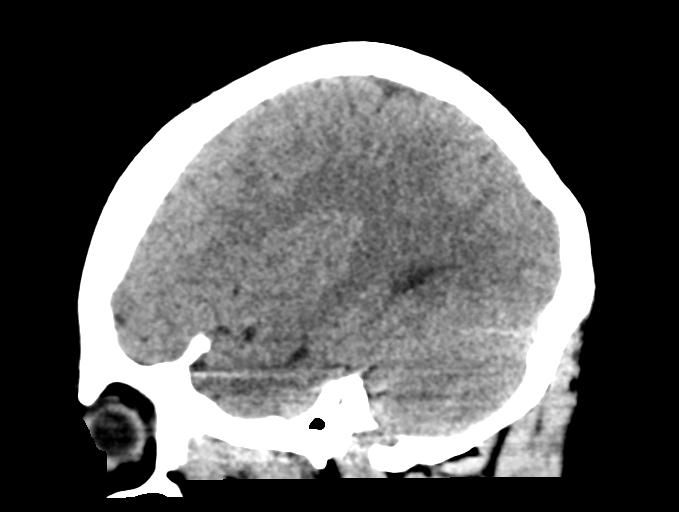
[im 33/65  brain]
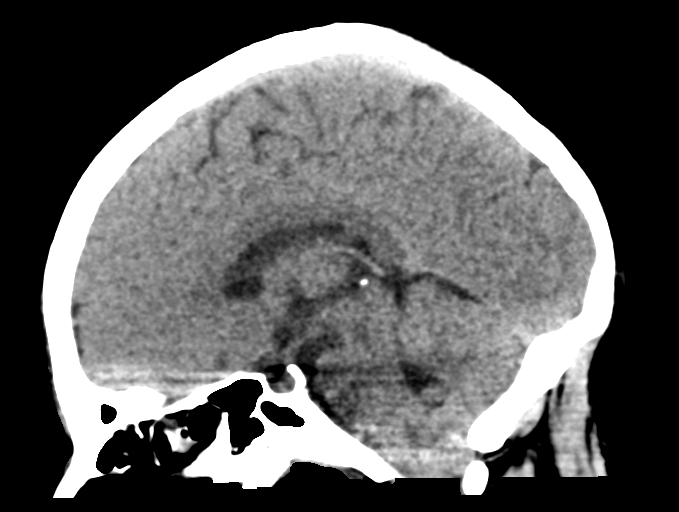
[im 43/65  brain]
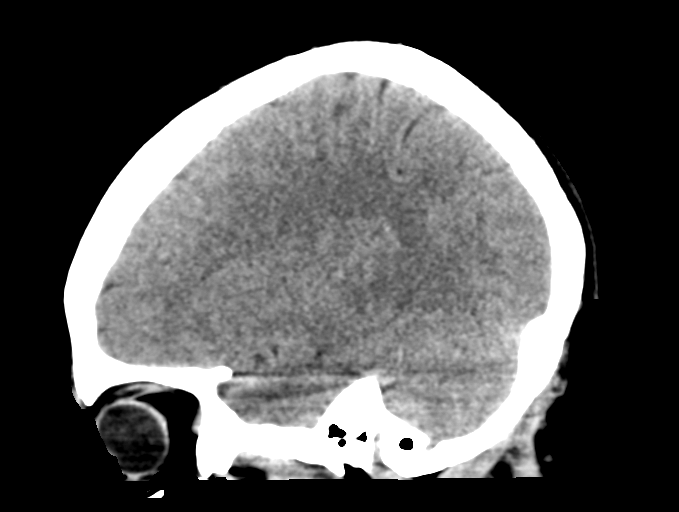

[15 of 47 positions shown; findings below may reference images not displayed]

FINDINGS: Brain: No evidence of acute infarction, hemorrhage, hydrocephalus,
extra-axial collection or mass lesion/mass effect.

Vascular: No hyperdense vessel or unexpected calcification.

Skull: Normal. Negative for fracture or focal lesion.

Sinuses/Orbits: No acute finding.

Other: None.
IMPRESSION: Normal head CT without contrast.

## 2019-01-30 ENCOUNTER — Ambulatory Visit: Payer: Self-pay | Admitting: Emergency Medicine

## 2019-01-30 ENCOUNTER — Encounter: Payer: Self-pay | Admitting: Emergency Medicine

## 2019-01-30 VITALS — BP 133/75 | HR 67 | Temp 97.7°F | Resp 16 | Ht 62.0 in | Wt 150.0 lb

## 2019-01-30 DIAGNOSIS — G518 Other disorders of facial nerve: Secondary | ICD-10-CM | POA: Insufficient documentation

## 2019-01-30 DIAGNOSIS — Z23 Encounter for immunization: Secondary | ICD-10-CM

## 2019-01-30 MED ORDER — ACYCLOVIR 400 MG PO TABS: 400.0000 mg | ORAL_TABLET | Freq: Four times a day (QID) | ORAL | 1 refills | Status: AC

## 2019-01-30 MED ORDER — PREDNISONE 20 MG PO TABS: 40.0000 mg | ORAL_TABLET | Freq: Every day | ORAL | 0 refills | Status: AC

## 2019-01-30 NOTE — Progress Notes (Signed)
Michelle Hoover 43 y.o.   Chief Complaint  Patient presents with  . Facial Swelling    on left side of cheek and inside of mouth, pt states the is some numbness/ x 1 day    HISTORY OF PRESENT ILLNESS: This is a 43 y.o. female complaining of left-sided facial symptoms that started yesterday at around noon.  Complaining of numbness, tingling, blistering itchy sensation, weakness to facial muscles.  Only localized to left side of the face.  Also had some pain around left ear.  No complete paralysis.  No history of Bell's palsy.  Had chickenpox as a kid.  No blister lesions to her face although it feels like she has one on her scalp.  HPI   Prior to Admission medications   Medication Sig Start Date End Date Taking? Authorizing Provider  acetaminophen (TYLENOL) 500 MG tablet Take 500 mg by mouth every 6 (six) hours as needed.    [provider]    Allergies  Allergen Reactions  . Penicillins Other (See Comments)    Rxn from childhood; lost all feeling in legs immediately after IM injection in hip.    There are no active problems to display for this patient.   History reviewed. No pertinent past medical history.  History reviewed. No pertinent surgical history.  Social History   Socioeconomic History  . Marital status: Married    Spouse name: Michelle Hoover  . Number of children: 3  . Years of education: 9  . Highest education level: Not on file  Occupational History  . Occupation: Housewife  Social Needs  . Financial resource strain: Not on file  . Food insecurity:    Worry: Not on file    Inability: Not on file  . Transportation needs:    Medical: Not on file    Non-medical: Not on file  Tobacco Use  . Smoking status: Never Smoker  . Smokeless tobacco: Never Used  Substance and Sexual Activity  . Alcohol use: No  . Drug use: No  . Sexual activity: Yes    Birth control/protection: None  Lifestyle  . Physical activity:    Days per week: Not on file   Minutes per session: Not on file  . Stress: Not on file  Relationships  . Social connections:    Talks on phone: Not on file    Gets together: Not on file    Attends religious service: Not on file    Active member of club or organization: Not on file    Attends meetings of clubs or organizations: Not on file    Relationship status: Not on file  . Intimate partner violence:    Fear of current or ex partner: Not on file    Emotionally abused: Not on file    Physically abused: Not on file    Forced sexual activity: Not on file  Other Topics Concern  . Not on file  Social History Narrative   [place]   [place]   Lives at home with husband and 3 children    Family History  Problem Relation Age of Onset  . Cancer Paternal Uncle   . Diabetes Paternal Grandmother   . Hypertension Mother   . Stroke Mother   . Diabetes Father   . Peripheral vascular disease Father        amputations of legs  . Epilepsy Sister      Review of Systems  Constitutional: Negative.  Negative for chills  and fever.  HENT: Positive for ear pain.   Eyes: Negative for blurred vision and double vision.  Respiratory: Negative.  Negative for cough.   Cardiovascular: Negative.  Negative for chest pain and palpitations.  Gastrointestinal: Negative.  Negative for abdominal pain, nausea and vomiting.  Skin: Negative.  Negative for rash.  Neurological: Positive for tingling and sensory change. Negative for dizziness, speech change, focal weakness and headaches.  Endo/Heme/Allergies: Negative.   All other systems reviewed and are negative.   Vitals:   01/30/19 0931  BP: 133/75  Pulse: 67  Resp: 16  Temp: 97.7 F (36.5 C)  SpO2: 98%    Physical Exam Vitals signs reviewed.  Constitutional:      Appearance: Normal appearance.  HENT:     Head: Normocephalic and atraumatic.     Right Ear: Tympanic membrane and ear canal normal.     Left Ear: Tympanic membrane and ear canal  normal.     Nose: Nose normal.     Mouth/Throat:     Mouth: Mucous membranes are moist.     Pharynx: Oropharynx is clear.  Neck:     Musculoskeletal: Normal range of motion.  Cardiovascular:     Rate and Rhythm: Normal rate and regular rhythm.     Heart sounds: Normal heart sounds.  Pulmonary:     Effort: Pulmonary effort is normal.     Breath sounds: Normal breath sounds.  Musculoskeletal: Normal range of motion.  Skin:    General: Skin is warm and dry.     Capillary Refill: Capillary refill takes less than 2 seconds.  Neurological:     General: No focal deficit present.     Mental Status: She is alert and oriented to person, place, and time.     Cranial Nerves: No cranial nerve deficit.     Sensory: No sensory deficit.     Motor: No weakness.     Coordination: Coordination normal.     Gait: Gait normal.     Deep Tendon Reflexes: Reflexes normal.  Psychiatric:        Mood and Affect: Mood normal.        Behavior: Behavior normal.      ASSESSMENT & PLAN: Donyale was seen today for facial swelling.  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 -     acyclovir (ZOVIRAX) 400 MG tablet; Take 1 tablet (400 mg total) by mouth 4 (four) times daily for 7 days.  Other orders -     Flu Vaccine QUAD 36+ mos IM    Patient Instructions       If you have lab work done today you will be contacted with your lab results within the next 2 weeks.  If you have not heard from us then please contact us. The fastest way to get your results is to register for My Chart.   IF you received an x-ray today, you will receive an invoice from [facility]. Please contact [facility] at [phone] with questions or concerns regarding your invoice.   IF you received labwork today, you  will receive an invoice from [facility]. Please contact [facility] at [phone] with questions or concerns regarding your invoice.   Our billing staff will not be able to assist you with questions regarding bills  from these companies.  You will be contacted with the lab results as soon as they are available. The fastest way to get your results is to activate your My Chart account. Instructions are located on the last page of this paperwork. If you have not heard from [place] regarding the results in 2 weeks, please contact this office.     Parlisis facial en los adultos Bell Palsy, Adult  La parlisis facial es una incapacidad a corto plazo para mover los msculos de una parte del rostro. La incapacidad del movimiento (parlisis) es el resultado de la inflamacin o compresin del nervio facial, que recorre el crneo y la parte inferior de las orejas hacia el costado del rostro (sptimo nervio craneal). Este nervio es responsable de los movimientos faciales, que incluyen parpadear, cerrar los ojos, sonrer y Development worker, community. Cules son las causas? Se desconoce la causa exacta de esta afeccin. Puede ser causada por una infeccin de un virus, como el virus de la varicela (herpes zster), de Epstein-Barr o de las paperas. Qu incrementa el riesgo? Es ms probable que usted sufra esta afeccin si:  Est embarazada.  Tiene diabetes.  Recientemente ha tenido una infeccin en la nariz, la garganta o las vas respiratorias (infeccin de las vas respiratorias superiores).  Tiene debilitado el sistema de defensa del organismo (sistema inmunitario).  Tuvo una lesin facial, como una fractura.  Tiene antecedentes familiares de parlisis facial. Cules son los signos o los sntomas? Los sntomas de esta afeccin [employer] siguientes:  Debilidad en un lado del rostro.  Cada del prpado y de la comisura de la boca.  Exceso de lagrimeo en uno de los ojos.  Dificultad para cerrar el  prpado.  Venita Sheffield.  Sequedad en la boca.  Cambios en el sentido del gusto.  Cambio en el aspecto facial.  Dolor detrs de [place].  Zumbido en una o ambas orejas.  Sensibilidad al ruido en [place].  Contraccin facial.  Dolor de cabeza.  Trastornos del habla.  Mareos.  Dificultad para comer o beber. La mayor parte del [place], solo una parte del rostro se ve afectada. Rara vez, la parlisis facial afecta todo el rostro. Cmo se diagnostica? Esta afeccin se diagnostica en funcin de lo siguiente:  Sus sntomas.  Sus antecedentes mdicos.  Un examen fsico. Es posible que, adems, deba consultar a un especialista en trastornos de los nervios (neurlogo) o enfermedades y afecciones oculares (oftalmlogo). Pueden hacerle estudios, por ejemplo:  Una prueba para controlar si hubo dao nervioso (electromiograma).  Estudios de diagnstico por imgenes, como exploracin por tomografa computarizada (TC) o Health visitor (RM).  Anlisis de [place]. Cmo se trata? Esta afeccin afecta a cada persona de un modo diferente. A veces, los sntomas desaparecen sin tratamiento en el plazo de unas semanas. Si el tratamiento es necesario, vara de persona a Social worker. El objetivo del tratamiento es reducir la inflamacin y proteger los ojos para que no se daen. El tratamiento de la parlisis facial puede incluir:  Medicamentos, por ejemplo: ? Corticoesteroides para reducir la hinchazn y la inflamacin. ? Medicamentos antivirales. ? Analgsicos, como aspirina, paracetamol o ibuprofeno.  Gotas oftlmicas o ungento para [employer].  Proteccin para los ojos, si no puede cerrar el ojo.  Ejercicios o masajes para recuperar la fuerza y la funcin del msculo (fisioterapia). Siga estas indicaciones en su casa:   Tome los medicamentos de venta libre y los recetados solamente como se lo haya indicado el  mdico.  Si el ojo est  afectado: ? Aplique el ungento o las gotas oftlmicas para mantener los ojos humectados tal como se lo haya indicado el mdico. ? Siga las indicaciones del mdico para el cuidado y Training and development officer de los ojos como se lo haya indicado el mdico.  Haga los ejercicios de fisioterapia como se lo haya indicado el mdico.  Concurra a todas las visitas de control como se lo haya indicado el mdico. Esto es importante. Comunquese con un mdico si:  Tiene fiebre.  Los sntomas no mejoran en un lapso de 2 a 3semanas, o empeoran.  El ojo est rojo, irritado o le duele.  Aparecen nuevos sntomas. Solicite ayuda de inmediato si:  Siente debilidad o adormecimiento en alguna parte del cuerpo que no sea el rostro.  Tiene dificultad para tragar.  Tiene dolor o rigidez en el cuello.  Tiene mareos o Company secretary. Resumen  La parlisis facial es una incapacidad a corto plazo para mover los msculos de una parte del rostro. La incapacidad para moverse (parlisis) es el resultado de la inflamacin o la compresin del nervio facial.  Esta afeccin afecta a cada persona de un modo diferente. A veces, los sntomas desaparecen sin tratamiento en el plazo de unas semanas.  Si el tratamiento es necesario, vara de persona a Social worker. El objetivo del tratamiento es reducir la inflamacin y proteger los ojos para que no se daen.  Comunquese con el mdico si los sntomas no mejoran en el lapso de 2 a 3semanas, o empeoran. Esta informacin no tiene Theme park manager el consejo del mdico. Asegrese de hacerle al mdico cualquier pregunta que tenga. Document Released: 12/14/2005 Document Revised: 01/05/2018 Document Reviewed: 07/05/2017 Elsevier Interactive Patient Education  2019 Elsevier Inc.      Edwina Barth, MD [facility]

## 2019-01-30 NOTE — Patient Instructions (Addendum)
If you have lab work done today you will be contacted with your lab results within the next 2 weeks.  If you have not heard from [place] then please contact us. The fastest way to get your results is to register for My Chart.   IF you received an x-ray today, you will receive an invoice from [facility]. Please contact [facility] at [phone] with questions or concerns regarding your invoice.   IF you received labwork today, you will receive an invoice from [facility]. Please contact [facility] at [phone] with questions or concerns regarding your invoice.   Our billing staff will not be able to assist you with questions regarding bills from these companies.  You will be contacted with the lab results as soon as they are available. The fastest way to get your results is to activate your My Chart account. Instructions are located on the last page of this paperwork. If you have not heard from [place] regarding the results in 2 weeks, please contact this office.     Parlisis facial en los adultos Bell Palsy, Adult  La parlisis facial es una incapacidad a corto plazo para mover los msculos de una parte del rostro. La incapacidad del movimiento (parlisis) es el resultado de la inflamacin o compresin del nervio facial, que recorre el crneo y la parte inferior de las orejas hacia el costado del rostro (sptimo nervio craneal). Este nervio es responsable de los movimientos faciales, que incluyen parpadear, cerrar los ojos, sonrer y Development worker, community. Cules son las causas? Se desconoce la causa exacta de esta afeccin. Puede ser causada por una infeccin de un virus, como el virus de la varicela (herpes zster), de Epstein-Barr o de las paperas. Qu incrementa el riesgo? Es ms probable que usted sufra esta afeccin si:  Est embarazada.  Tiene diabetes.  Recientemente ha tenido una infeccin en la nariz, la garganta o las vas respiratorias (infeccin de las vas  respiratorias superiores).  Tiene debilitado el sistema de defensa del organismo (sistema inmunitario).  Tuvo una lesin facial, como una fractura.  Tiene antecedentes familiares de parlisis facial. Cules son los signos o los sntomas? Los sntomas de esta afeccin [employer] siguientes:  Debilidad en un lado del rostro.  Cada del prpado y de la comisura de la boca.  Exceso de lagrimeo en uno de los ojos.  Dificultad para cerrar el prpado.  Venita Sheffield.  Sequedad en la boca.  Cambios en el sentido del gusto.  Cambio en el aspecto facial.  Dolor detrs de [place].  Zumbido en una o ambas orejas.  Sensibilidad al ruido en [place].  Contraccin facial.  Dolor de cabeza.  Trastornos del habla.  Mareos.  Dificultad para comer o beber. La mayor parte del [place], solo una parte del rostro se ve afectada. Rara vez, la parlisis facial afecta todo el rostro. Cmo se diagnostica? Esta afeccin se diagnostica en funcin de lo siguiente:  Sus sntomas.  Sus antecedentes mdicos.  Un examen fsico. Es posible que, adems, deba consultar a un especialista en trastornos de los nervios (neurlogo) o enfermedades y afecciones oculares (oftalmlogo). Pueden hacerle estudios, por ejemplo:  Una prueba para controlar si hubo dao nervioso (electromiograma).  Estudios de diagnstico por imgenes, como exploracin por tomografa computarizada (TC) o Health visitor (RM).  Anlisis de [place]. Cmo se trata? Esta afeccin afecta a cada persona de un modo diferente. A veces, los sntomas desaparecen sin tratamiento en el plazo de  unas semanas. Si el tratamiento es necesario, vara de persona a Social workerpersona. El objetivo del tratamiento es reducir la inflamacin y proteger los ojos para que no se daen. El tratamiento de la parlisis facial puede incluir:  Medicamentos, por ejemplo: ? Corticoesteroides para reducir la hinchazn y la inflamacin. ? Medicamentos  antivirales. ? Analgsicos, como aspirina, paracetamol o ibuprofeno.  Gotas oftlmicas o ungento para MGM MIRAGEmantener los ojos humectados.  Proteccin para los ojos, si no puede cerrar el ojo.  Ejercicios o masajes para recuperar la fuerza y la funcin del msculo (fisioterapia). Siga estas indicaciones en su casa:   Tome los medicamentos de venta libre y los recetados solamente como se lo haya indicado el mdico.  Si el ojo est afectado: ? Aplique el ungento o las gotas oftlmicas para Pharmacologistmantener los ojos humectados tal como se lo haya indicado el mdico. ? Siga las indicaciones del mdico para el cuidado y Training and development officerla proteccin de los ojos como se lo haya indicado el mdico.  Haga los ejercicios de fisioterapia como se lo haya indicado el mdico.  Concurra a todas las visitas de control como se lo haya indicado el mdico. Esto es importante. Comunquese con un mdico si:  Tiene fiebre.  Los sntomas no mejoran en un lapso de 2 a 3semanas, o empeoran.  El ojo est rojo, irritado o le duele.  Aparecen nuevos sntomas. Solicite ayuda de inmediato si:  Siente debilidad o adormecimiento en alguna parte del cuerpo que no sea el rostro.  Tiene dificultad para tragar.  Tiene dolor o rigidez en el cuello.  Tiene mareos o Company secretaryle falta el aire. Resumen  La parlisis facial es una incapacidad a corto plazo para mover los msculos de una parte del rostro. La incapacidad para moverse (parlisis) es el resultado de la inflamacin o la compresin del nervio facial.  Esta afeccin afecta a cada persona de un modo diferente. A veces, los sntomas desaparecen sin tratamiento en el plazo de unas semanas.  Si el tratamiento es necesario, vara de persona a Social workerpersona. El objetivo del tratamiento es reducir la inflamacin y proteger los ojos para que no se daen.  Comunquese con el mdico si los sntomas no mejoran en el lapso de 2 a 3semanas, o empeoran. Esta informacin no tiene Theme park managercomo fin reemplazar el  consejo del mdico. Asegrese de hacerle al mdico cualquier pregunta que tenga. Document Released: 12/14/2005 Document Revised: 01/05/2018 Document Reviewed: 07/05/2017 Elsevier Interactive Patient Education  2019 [employer]

## 2024-08-09 ENCOUNTER — Other Ambulatory Visit: Payer: Self-pay | Admitting: *Deleted

## 2024-08-09 DIAGNOSIS — D649 Anemia, unspecified: Secondary | ICD-10-CM

## 2024-08-10 ENCOUNTER — Inpatient Hospital Stay: Payer: Self-pay | Attending: Hematology and Oncology | Admitting: Hematology and Oncology

## 2024-08-10 ENCOUNTER — Inpatient Hospital Stay: Payer: Self-pay

## 2024-08-10 VITALS — BP 116/71 | HR 53 | Temp 97.7°F | Resp 18 | Ht 62.0 in | Wt 154.7 lb

## 2024-08-10 DIAGNOSIS — Z809 Family history of malignant neoplasm, unspecified: Secondary | ICD-10-CM | POA: Insufficient documentation

## 2024-08-10 DIAGNOSIS — Z7722 Contact with and (suspected) exposure to environmental tobacco smoke (acute) (chronic): Secondary | ICD-10-CM | POA: Insufficient documentation

## 2024-08-10 DIAGNOSIS — N939 Abnormal uterine and vaginal bleeding, unspecified: Secondary | ICD-10-CM | POA: Insufficient documentation

## 2024-08-10 DIAGNOSIS — R220 Localized swelling, mass and lump, head: Secondary | ICD-10-CM | POA: Insufficient documentation

## 2024-08-10 DIAGNOSIS — D500 Iron deficiency anemia secondary to blood loss (chronic): Secondary | ICD-10-CM | POA: Insufficient documentation

## 2024-08-10 DIAGNOSIS — D649 Anemia, unspecified: Secondary | ICD-10-CM

## 2024-08-10 LAB — CBC WITH DIFFERENTIAL (CANCER CENTER ONLY)
Abs Immature Granulocytes: 0.04 K/uL (ref 0.00–0.07)
Basophils Absolute: 0 K/uL (ref 0.0–0.1)
Basophils Relative: 0 %
Eosinophils Absolute: 0.1 K/uL (ref 0.0–0.5)
Eosinophils Relative: 2 %
HCT: 32.4 % — ABNORMAL LOW (ref 36.0–46.0)
Hemoglobin: 9.3 g/dL — ABNORMAL LOW (ref 12.0–15.0)
Immature Granulocytes: 1 %
Lymphocytes Relative: 29 %
Lymphs Abs: 1.6 K/uL (ref 0.7–4.0)
MCH: 20.1 pg — ABNORMAL LOW (ref 26.0–34.0)
MCHC: 28.7 g/dL — ABNORMAL LOW (ref 30.0–36.0)
MCV: 70.1 fL — ABNORMAL LOW (ref 80.0–100.0)
Monocytes Absolute: 0.4 K/uL (ref 0.1–1.0)
Monocytes Relative: 7 %
Neutro Abs: 3.4 K/uL (ref 1.7–7.7)
Neutrophils Relative %: 61 %
Platelet Count: 390 K/uL (ref 150–400)
RBC: 4.62 MIL/uL (ref 3.87–5.11)
WBC Count: 5.5 K/uL (ref 4.0–10.5)
nRBC: 0 % (ref 0.0–0.2)

## 2024-08-10 LAB — SAMPLE TO BLOOD BANK

## 2024-08-10 LAB — CMP (CANCER CENTER ONLY)
ALT: 14 U/L (ref 0–44)
AST: 18 U/L (ref 15–41)
Albumin: 4.2 g/dL (ref 3.5–5.0)
Alkaline Phosphatase: 62 U/L (ref 38–126)
Anion gap: 5 (ref 5–15)
BUN: 12 mg/dL (ref 6–20)
CO2: 27 mmol/L (ref 22–32)
Calcium: 8.6 mg/dL — ABNORMAL LOW (ref 8.9–10.3)
Chloride: 107 mmol/L (ref 98–111)
Creatinine: 0.54 mg/dL (ref 0.44–1.00)
GFR, Estimated: >60 mL/min (ref >60)
Glucose, Bld: 103 mg/dL — ABNORMAL HIGH (ref 70–99)
Potassium: 3.7 mmol/L (ref 3.5–5.1)
Sodium: 139 mmol/L (ref 135–145)
Total Bilirubin: 0.4 mg/dL (ref 0.0–1.2)
Total Protein: 7.2 g/dL (ref 6.5–8.1)

## 2024-08-10 LAB — IRON AND IRON BINDING CAPACITY (CC-WL,HP ONLY)
Iron: 484 ug/dL — ABNORMAL HIGH (ref 28–170)
Saturation Ratios: 81 % — ABNORMAL HIGH (ref 10.4–31.8)
TIBC: 599 ug/dL — ABNORMAL HIGH (ref 250–450)
UIBC: 115 ug/dL

## 2024-08-10 NOTE — Progress Notes (Signed)
  [facility] Center CONSULT NOTE  Patient Care Team: Adella Norris, MD as PCP - General (Internal Medicine)  CHIEF COMPLAINTS/PURPOSE OF CONSULTATION:    HISTORY OF PRESENTING ILLNESS:    History of Present Illness Michelle Hoover is a 48 year old female with iron deficiency anemia who presents for evaluation of her condition. She was referred for evaluation of iron deficiency anemia.  Iron deficiency anemia was initially identified after a fall two months ago. Hemoglobin levels were 7.5, improving to 9.8 with dietary changes and iron supplementation. She experiences heavy menstrual cycles every three days, which she believes contribute to her anemia. She has not consulted a gynecologist. Her diet now includes more iron-rich foods, and she takes iron supplements and calcium. She has cravings for ice chips, a symptom persisting for several years.  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 She does not smoke or drink alcohol. Her family history includes throat cancer on her uncle's side.    I reviewed her records extensively and collaborated the history with the patient.   MEDICAL HISTORY:  No previous past medical problems  SURGICAL HISTORY: No prior surgeries  SOCIAL HISTORY: Denies any tobacco or alcohol or recreational drug use.  She works in [employer]. Social History   Socioeconomic History   Marital status: Married    Spouse name: Rebecca   Number of children: 3   Years of education: 9   Highest education level: Not on file  Occupational History   Occupation: Housewife  Tobacco Use   Smoking status: Never   Smokeless tobacco: Never  Vaping Use   Vaping status: Never Used  Substance and Sexual Activity   Alcohol use: No   Drug use: No   Sexual activity: Yes    Birth control/protection: None  Other Topics Concern   Not on file  Social History Narrative   Originally from [place]   C[place] to [place]   Lives at  home with husband and 3 children   Social Drivers of Corporate investment banker Strain: Not on file  Food Insecurity: No Food Insecurity (08/10/2024)   Hunger Vital Sign    Worried About Running Out of Food in the Last Year: Never true    Ran Out of Food in the Last Year: Never true  Transportation Needs: No Transportation Needs (08/10/2024)   PRAPARE - Administrator, Civil Service (Medical): No    Lack of Transportation (Non-Medical): No  Physical Activity: Not on file  Stress: Not on file  Social Connections: Not on file  Intimate Partner Violence: Not At Risk (08/10/2024)   Humiliation, Afraid, Rape, and Kick questionnaire    Fear of Current or Ex-Partner: No    Emotionally Abused: No    Physically Abused: No    Sexually Abused: No    FAMILY HISTORY: Family History  Problem Relation Age of Onset   Cancer Paternal Uncle    Diabetes Paternal Grandmother    Hypertension Mother    Stroke Mother    Diabetes Father    Peripheral vascular disease Father        amputations of legs   Epilepsy Sister     ALLERGIES:  is allergic to penicillins.  MEDICATIONS:  Current Outpatient Medications  Medication Sig Dispense Refill   acetaminophen  (TYLENOL ) 500 MG tablet Take 500 mg by mouth every 6 (six) hours as needed.     ascorbic acid (VITAMIN C) 1000 MG tablet Take 1,000  mg by mouth.     ferrous sulfate  325 (65 FE) MG tablet Take 325 mg by mouth.     No current facility-administered medications for this visit.    REVIEW OF SYSTEMS:   Constitutional: Fatigue All other systems were reviewed with the patient and are negative.  PHYSICAL EXAMINATION: ECOG PERFORMANCE STATUS: 1 - Symptomatic but completely ambulatory  Vitals:   08/10/24 1523  BP: 116/71  Pulse: (!) 53  Resp: 18  Temp: 97.7 F (36.5 C)  SpO2: 100%   Filed Weights   08/10/24 1523  Weight: 154 lb 11.2 oz (70.2 kg)    GENERAL:alert, no distress and comfortable      LABORATORY DATA:  I  have reviewed the data as listed Lab Results  Component Value Date   WBC 5.5 08/10/2024   HGB 9.3 (L) 08/10/2024   HCT 32.4 (L) 08/10/2024   MCV 70.1 (L) 08/10/2024   PLT 390 08/10/2024   Lab Results  Component Value Date   NA 139 08/10/2024   K 3.7 08/10/2024   CL 107 08/10/2024   CO2 27 08/10/2024    RADIOGRAPHIC STUDIES: I have personally reviewed the radiological reports and agreed with the findings in the report.  ASSESSMENT AND PLAN:  Iron deficiency anemia due to chronic blood loss Lab review: 08/01/2024: Hemoglobin 7.5, MCV 67, RDW 18.6, iron saturation 4%, TIBC 523 08/09/2024: Hemoglobin 9.8, MCV 69.3, platelets 458  Iron deficiency anemia I counseled extensively regarding the different causes of iron deficiency including blood loss and malabsorption.   Recommendation: 1.  GYN evaluation 2. proceed with IV iron infusions  I discussed with the patient that potentially there may be a need for additional IV iron infusions if the iron levels were to remain low in the future. The frequency of need of IV iron would depend on many other factors including the rate of loss and by the degree of absorption.  Return to clinic in 3 months with recheck on iron studies and hemoglobin.  Assessment and Plan Assessment & Plan Iron deficiency anemia Iron deficiency anemia with recent hemoglobin improvement from 7.5 to 9.8 over ten days, likely secondary to abnormal uterine bleeding. - Administer IV iron infusions, three doses, one week apart at [facility] clinic. - Recheck iron levels in three months. - Ensure follow-up with gynecologist to address abnormal uterine bleeding.  Abnormal uterine bleeding Abnormal uterine bleeding suspected to be the cause of iron deficiency anemia. Heavy menstrual cycles occurring every three days. - Refer to gynecologist for evaluation and management of heavy menstrual cycles.  Right scalp mass, post-traumatic New right scalp mass following trauma  two months ago. Initial pain resolved with antibiotics. - Order CT scan of the head to evaluate the right scalp mass. - Call with CT scan results.    All questions were answered. The patient knows to call the clinic with any problems, questions or concerns.    Viinay K Eladio [place], MD 08/10/24

## 2024-08-10 NOTE — Assessment & Plan Note (Signed)
 Lab review: 08/01/2024: Hemoglobin 7.5, MCV 67, RDW 18.6, iron saturation 4%, TIBC 523 08/09/2024: Hemoglobin 9.8, MCV 69.3, platelets 458  Iron deficiency anemia I counseled extensively regarding the different causes of iron deficiency including blood loss and malabsorption.   Recommendation: 1.  GI/GYN evaluation 2. proceed with IV iron infusions  I discussed with the patient that potentially there may be a need for additional IV iron infusions if the iron levels were to remain low in the future. The frequency of need of IV iron would depend on many other factors including the rate of loss and by the degree of absorption.  Return to clinic in 3 months with recheck on iron studies and hemoglobin.

## 2024-08-11 LAB — FERRITIN: Ferritin: 86 ng/mL (ref 11–307)

## 2024-08-17 ENCOUNTER — Ambulatory Visit (HOSPITAL_COMMUNITY): Payer: Self-pay

## 2024-08-24 ENCOUNTER — Telehealth: Payer: Self-pay | Admitting: *Deleted

## 2024-08-24 ENCOUNTER — Inpatient Hospital Stay: Payer: Self-pay | Admitting: Hematology and Oncology

## 2024-08-24 NOTE — Assessment & Plan Note (Deleted)
 Lab review: 08/01/2024: Hemoglobin 7.5, MCV 67, RDW 18.6, iron saturation 4%, TIBC 523 08/09/2024: Hemoglobin 9.8, MCV 69.3, platelets 458

## 2024-08-24 NOTE — Telephone Encounter (Signed)
 Called and left pt voice message that today's appt is being postponed until CT scan is completed. Advised to call and reschedule CT appt so that results can be reviewed by provider.

## 2024-09-26 ENCOUNTER — Ambulatory Visit: Payer: Self-pay | Admitting: Radiology

## 2024-10-04 ENCOUNTER — Encounter: Payer: Self-pay | Admitting: Family Medicine

## 2024-11-08 ENCOUNTER — Other Ambulatory Visit: Payer: Self-pay | Admitting: Hematology and Oncology

## 2024-11-08 DIAGNOSIS — D500 Iron deficiency anemia secondary to blood loss (chronic): Secondary | ICD-10-CM

## 2024-11-10 ENCOUNTER — Inpatient Hospital Stay: Payer: Self-pay | Attending: Hematology and Oncology

## 2024-11-10 DIAGNOSIS — D500 Iron deficiency anemia secondary to blood loss (chronic): Secondary | ICD-10-CM

## 2024-11-10 DIAGNOSIS — D509 Iron deficiency anemia, unspecified: Secondary | ICD-10-CM | POA: Insufficient documentation

## 2024-11-10 LAB — CBC WITH DIFFERENTIAL (CANCER CENTER ONLY)
Abs Immature Granulocytes: 0.03 K/uL (ref 0.00–0.07)
Basophils Absolute: 0 K/uL (ref 0.0–0.1)
Basophils Relative: 0 %
Eosinophils Absolute: 0.1 K/uL (ref 0.0–0.5)
Eosinophils Relative: 2 %
HCT: 41.3 % (ref 36.0–46.0)
Hemoglobin: 13.8 g/dL (ref 12.0–15.0)
Immature Granulocytes: 1 %
Lymphocytes Relative: 30 %
Lymphs Abs: 1.5 K/uL (ref 0.7–4.0)
MCH: 28.5 pg (ref 26.0–34.0)
MCHC: 33.4 g/dL (ref 30.0–36.0)
MCV: 85.3 fL (ref 80.0–100.0)
Monocytes Absolute: 0.4 K/uL (ref 0.1–1.0)
Monocytes Relative: 9 %
Neutro Abs: 3 K/uL (ref 1.7–7.7)
Neutrophils Relative %: 58 %
Platelet Count: 219 K/uL (ref 150–400)
RBC: 4.84 MIL/uL (ref 3.87–5.11)
RDW: 14.7 % (ref 11.5–15.5)
WBC Count: 5.1 K/uL (ref 4.0–10.5)
nRBC: 0 % (ref 0.0–0.2)

## 2024-11-10 LAB — FERRITIN: Ferritin: 40 ng/mL (ref 11–307)

## 2024-11-10 LAB — IRON AND IRON BINDING CAPACITY (CC-WL,HP ONLY)
Iron: 189 ug/dL — ABNORMAL HIGH (ref 28–170)
Saturation Ratios: 49 % — ABNORMAL HIGH (ref 10.4–31.8)
TIBC: 388 ug/dL (ref 250–450)
UIBC: 199 ug/dL (ref 148–442)

## 2024-11-13 ENCOUNTER — Inpatient Hospital Stay: Payer: Self-pay | Admitting: Hematology and Oncology

## 2024-11-13 DIAGNOSIS — D500 Iron deficiency anemia secondary to blood loss (chronic): Secondary | ICD-10-CM

## 2024-11-13 NOTE — Progress Notes (Signed)
 I called her using the Spanish interpreter and left a voicemail for her to call us  back.

## 2024-11-13 NOTE — Assessment & Plan Note (Signed)
 Lab review: 08/01/2024: Hemoglobin 7.5, MCV 67, RDW 18.6, iron saturation 4%, TIBC 523 08/09/2024: Hemoglobin 9.8, MCV 69.3, platelets 458 11/10/2024: Hemoglobin 13.8, MCV 85.3, iron saturation 49%, ferritin 40   No further issues with iron deficiency  Recheck labs in 6 months

## 2024-11-16 ENCOUNTER — Ambulatory Visit: Payer: Self-pay | Admitting: Radiology

## 2024-12-19 ENCOUNTER — Other Ambulatory Visit: Payer: Self-pay | Admitting: Pharmacist
